# Patient Record
Sex: Male | Born: 1940 | Hispanic: No | Marital: Married | Smoking: Former smoker
Health system: Southern US, Community
[De-identification: ages and names within clinical notes are randomized; demographics above are authoritative.]

## PROBLEM LIST (undated history)

## (undated) DIAGNOSIS — E039 Hypothyroidism, unspecified: Secondary | ICD-10-CM

## (undated) HISTORY — DX: Hypothyroidism, unspecified: E03.9

---

## 2008-08-30 ENCOUNTER — Ambulatory Visit: Payer: Self-pay | Admitting: Internal Medicine

## 2008-08-30 DIAGNOSIS — J45909 Unspecified asthma, uncomplicated: Secondary | ICD-10-CM | POA: Insufficient documentation

## 2008-08-30 DIAGNOSIS — K5900 Constipation, unspecified: Secondary | ICD-10-CM | POA: Insufficient documentation

## 2008-08-30 DIAGNOSIS — J309 Allergic rhinitis, unspecified: Secondary | ICD-10-CM | POA: Insufficient documentation

## 2008-08-30 DIAGNOSIS — E039 Hypothyroidism, unspecified: Secondary | ICD-10-CM | POA: Insufficient documentation

## 2008-09-03 ENCOUNTER — Encounter (INDEPENDENT_AMBULATORY_CARE_PROVIDER_SITE_OTHER): Payer: Self-pay | Admitting: Internal Medicine

## 2008-09-04 ENCOUNTER — Encounter (INDEPENDENT_AMBULATORY_CARE_PROVIDER_SITE_OTHER): Payer: Self-pay | Admitting: Internal Medicine

## 2008-09-06 ENCOUNTER — Encounter (INDEPENDENT_AMBULATORY_CARE_PROVIDER_SITE_OTHER): Payer: Self-pay | Admitting: Internal Medicine

## 2008-09-12 ENCOUNTER — Telehealth (INDEPENDENT_AMBULATORY_CARE_PROVIDER_SITE_OTHER): Payer: Self-pay | Admitting: Internal Medicine

## 2008-09-30 ENCOUNTER — Telehealth (INDEPENDENT_AMBULATORY_CARE_PROVIDER_SITE_OTHER): Payer: Self-pay | Admitting: Internal Medicine

## 2008-10-01 ENCOUNTER — Encounter (INDEPENDENT_AMBULATORY_CARE_PROVIDER_SITE_OTHER): Payer: Self-pay | Admitting: Internal Medicine

## 2008-10-11 ENCOUNTER — Ambulatory Visit: Payer: Self-pay | Admitting: Internal Medicine

## 2008-10-14 ENCOUNTER — Encounter (INDEPENDENT_AMBULATORY_CARE_PROVIDER_SITE_OTHER): Payer: Self-pay | Admitting: Internal Medicine

## 2008-10-15 LAB — CONVERTED CEMR LAB
Free T4: 1.02 ng/dL (ref 0.80–1.80)
T3, Free: 3.2 pg/mL (ref 2.3–4.2)
TSH: 1.153 microintl units/mL (ref 0.350–4.500)

## 2008-12-09 ENCOUNTER — Telehealth (INDEPENDENT_AMBULATORY_CARE_PROVIDER_SITE_OTHER): Payer: Self-pay | Admitting: Internal Medicine

## 2009-01-24 ENCOUNTER — Ambulatory Visit: Payer: Self-pay | Admitting: Internal Medicine

## 2009-01-31 ENCOUNTER — Telehealth (INDEPENDENT_AMBULATORY_CARE_PROVIDER_SITE_OTHER): Payer: Self-pay | Admitting: *Deleted

## 2009-02-14 ENCOUNTER — Ambulatory Visit: Payer: Self-pay | Admitting: Internal Medicine

## 2009-03-26 ENCOUNTER — Encounter (INDEPENDENT_AMBULATORY_CARE_PROVIDER_SITE_OTHER): Payer: Self-pay | Admitting: Internal Medicine

## 2009-03-31 ENCOUNTER — Encounter (INDEPENDENT_AMBULATORY_CARE_PROVIDER_SITE_OTHER): Payer: Self-pay | Admitting: Internal Medicine

## 2009-05-28 ENCOUNTER — Encounter (HOSPITAL_COMMUNITY): Admission: RE | Admit: 2009-05-28 | Discharge: 2009-06-27 | Payer: Self-pay | Admitting: *Deleted

## 2010-04-22 ENCOUNTER — Ambulatory Visit: Payer: Self-pay | Admitting: Internal Medicine

## 2010-05-06 ENCOUNTER — Ambulatory Visit: Payer: Self-pay | Admitting: Internal Medicine

## 2010-07-16 ENCOUNTER — Ambulatory Visit: Admit: 2010-07-16 | Payer: Self-pay | Admitting: Internal Medicine

## 2010-08-10 ENCOUNTER — Ambulatory Visit (INDEPENDENT_AMBULATORY_CARE_PROVIDER_SITE_OTHER): Payer: Self-pay | Admitting: Internal Medicine

## 2010-09-09 ENCOUNTER — Ambulatory Visit (INDEPENDENT_AMBULATORY_CARE_PROVIDER_SITE_OTHER): Payer: Self-pay | Admitting: Internal Medicine

## 2010-09-15 ENCOUNTER — Ambulatory Visit (INDEPENDENT_AMBULATORY_CARE_PROVIDER_SITE_OTHER): Payer: MEDICARE | Admitting: Internal Medicine

## 2010-09-15 DIAGNOSIS — K5909 Other constipation: Secondary | ICD-10-CM

## 2010-11-16 ENCOUNTER — Ambulatory Visit (INDEPENDENT_AMBULATORY_CARE_PROVIDER_SITE_OTHER): Payer: MEDICARE | Admitting: Internal Medicine

## 2010-11-16 DIAGNOSIS — K5909 Other constipation: Secondary | ICD-10-CM

## 2011-01-13 ENCOUNTER — Encounter (INDEPENDENT_AMBULATORY_CARE_PROVIDER_SITE_OTHER): Payer: Self-pay

## 2011-01-13 DIAGNOSIS — E039 Hypothyroidism, unspecified: Secondary | ICD-10-CM | POA: Insufficient documentation

## 2011-01-26 ENCOUNTER — Ambulatory Visit (INDEPENDENT_AMBULATORY_CARE_PROVIDER_SITE_OTHER): Payer: MEDICARE | Admitting: Internal Medicine

## 2011-02-08 ENCOUNTER — Encounter (INDEPENDENT_AMBULATORY_CARE_PROVIDER_SITE_OTHER): Payer: Self-pay | Admitting: *Deleted

## 2011-04-20 ENCOUNTER — Encounter (INDEPENDENT_AMBULATORY_CARE_PROVIDER_SITE_OTHER): Payer: Self-pay | Admitting: Internal Medicine

## 2011-04-20 ENCOUNTER — Ambulatory Visit (INDEPENDENT_AMBULATORY_CARE_PROVIDER_SITE_OTHER): Payer: Medicare Other | Admitting: Internal Medicine

## 2011-04-20 VITALS — BP 110/70 | HR 72 | Temp 98.1°F | Resp 14 | Ht 71.0 in | Wt 180.0 lb

## 2011-04-20 DIAGNOSIS — K5900 Constipation, unspecified: Secondary | ICD-10-CM

## 2011-04-20 DIAGNOSIS — K5909 Other constipation: Secondary | ICD-10-CM

## 2011-04-20 NOTE — Patient Instructions (Signed)
No changes made in your meds today.

## 2011-04-25 NOTE — Progress Notes (Signed)
Presenting complaint; followup for bowel problems and constipation. Subjective; Omar Cox is 70 year old Caucasian male who is here for scheduled visit. 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 He has undergone extensive evaluation including anorectal manometry when he was living in [place]. This workup has been documented in my previous notes. He was last seen on 11/16/2010. He believes is 60% better. Dr. Malvin Johns suggested to him to go on gluten-free diet but 2 months ago. He is thrilled that he has lost 12 pounds. He generally has one to 2 stools daily he seemed to get loose stool when he takes magnesium he denies melena or rectal bleeding. He always has sense of incomplete evacuation even though he's had good bowel movement. He would go he's not 100% better he states he is doing much better than last year. Current medications Current Outpatient Prescriptions  Medication Sig Dispense Refill  . aspirin 81 MG tablet Take 81 mg by mouth daily.        . BUSPIRONE HCL PO Take 300 mg by mouth daily. THIS IS BUSPIRONE XL 150 MG DAILY      . fish oil-omega-3 fatty acids 1000 MG capsule Take 1 g by mouth daily. 950 mg      . GAMMA AMINOBUTYRIC ACID PO Take 1 capsule by mouth daily.        Marland Kitchen levothyroxine (SYNTHROID, LEVOTHROID) 100 MCG tablet Take 100 mcg by mouth daily.        Marland Kitchen lubiprostone (AMITIZA) 8 MCG capsule Take 8 mcg by mouth. Takes 3 capsules every night      . Specialty Vitamins Products (MAGNESIUM, AMINO ACID CHELATE,) 133 MG tablet Take 1 tablet by mouth 2 (two) times daily. 500 mg        objective BP 110/70  Pulse 72  Temp(Src) 98.1 F (36.7 C) (Oral)  Resp 14  Ht 5\' 11"  (1.803 m)  Wt 180 lb (81.647 kg)  BMI 25.10 kg/m2 Conjunctiva is pink. Sclerae nonicteric. Oropharyngeal mucosa is normal No neck masses are noted Abdomen is flat bowel, sounds are normal. Soft abdomen without tenderness organomegaly or masses. No clubbing  noted. Assessment Defecation disorder with constipation and sense of incomplete evacuation. He has been tried on different medications including lorazepam and nothing has worked well. Dr. Delbert Harness increase the dose of buspirone but it did not make any difference. As versus constipation is concerned he is doing much better with therapy including low-dose amitiza. Recommendations. He will continue current regimen. Next office visit in 6 months.

## 2011-10-07 ENCOUNTER — Encounter (INDEPENDENT_AMBULATORY_CARE_PROVIDER_SITE_OTHER): Payer: Self-pay | Admitting: *Deleted

## 2011-11-16 ENCOUNTER — Encounter (INDEPENDENT_AMBULATORY_CARE_PROVIDER_SITE_OTHER): Payer: Self-pay | Admitting: Internal Medicine

## 2011-11-16 ENCOUNTER — Ambulatory Visit (INDEPENDENT_AMBULATORY_CARE_PROVIDER_SITE_OTHER): Payer: Medicare Other | Admitting: Internal Medicine

## 2011-11-16 VITALS — BP 118/70 | HR 72 | Temp 97.1°F | Resp 20 | Ht 72.0 in | Wt 186.6 lb

## 2011-11-16 DIAGNOSIS — K5900 Constipation, unspecified: Secondary | ICD-10-CM

## 2011-11-16 DIAGNOSIS — K9041 Non-celiac gluten sensitivity: Secondary | ICD-10-CM | POA: Insufficient documentation

## 2011-11-16 DIAGNOSIS — K900 Celiac disease: Secondary | ICD-10-CM

## 2011-11-16 NOTE — Progress Notes (Signed)
Presenting complaint;  Followup for bowel problems.  Subjective:  Omar Cox a 71 year old Caucasian male who is in for scheduled visit. He was last seen in October 2012. He decided to go go on gluten-free diet after he talked to Dr. Malvin Johns about 3 months ago. He feels a lot better. He has noted decrease in his bloating and his bowels move more on schedule and he has less sensation of incomplete evacuation and on some days he's not aware of the sensation. His appetite is good. He has gained 6 pounds since his last visit. He reports no side effects with the current dose of Amitiza.  Current Medications: Current Outpatient Prescriptions  Medication Sig Dispense Refill  . aspirin 81 MG tablet Take 81 mg by mouth daily.        . BUSPIRONE HCL PO Take 300 mg by mouth daily. THIS IS BUSPIRONE XL 150 MG DAILY      . fish oil-omega-3 fatty acids 1000 MG capsule Take 1 g by mouth daily. 950 mg      . GAMMA AMINOBUTYRIC ACID PO Take 1 capsule by mouth daily.        Marland Kitchen levothyroxine (SYNTHROID, LEVOTHROID) 100 MCG tablet Take 100 mcg by mouth daily.        Marland Kitchen lubiprostone (AMITIZA) 24 MCG capsule Take 24 mcg by mouth. Patient takes (2) at night      . Multiple Vitamin (MULTIVITAMIN) capsule Take 1 capsule by mouth daily.         Objective: Blood pressure 118/70, pulse 72, temperature 97.1 F (36.2 C), temperature source Oral, resp. rate 20, height 6' (1.829 m), weight 186 lb 9.6 oz (84.641 kg). Conjunctiva is pink. Sclera is nonicteric Oropharyngeal mucosa is normal. No neck masses or thyromegaly noted. Abdomen is symmetrical soft and nontender without organomegaly or masses No LE edema or clubbing noted.  Assessment:  Defecation disorder but constipation and sense of incomplete evacuation. He underwent caudal evaluation in [place] and he had colonoscopy and a rectal manometry. He is doing much better with gluten-free diet. His evaluation previously for celiac disease has been negative. He  may have gluten intolerance or maybe he is having less bloating or change in his diet.   Plan:  Patient will continue Amitiza at current dose. He will stay on gluten-free diet as long as it is helping him. Office visit in one year unless symptoms progress

## 2011-11-16 NOTE — Patient Instructions (Signed)
Notify if symptoms relapse. 

## 2012-04-25 ENCOUNTER — Telehealth (INDEPENDENT_AMBULATORY_CARE_PROVIDER_SITE_OTHER): Payer: Self-pay | Admitting: *Deleted

## 2012-04-25 NOTE — Telephone Encounter (Signed)
Patient call and would like to start winging himself off the Amitiza. Currently taking 24 mcg and is feeling much better. His next apt is not due until May 2014. He would like to just speak with someone about this. His return phone number is [phone].

## 2012-04-27 NOTE — Telephone Encounter (Signed)
Doctor Karilyn Cota was paged

## 2012-04-28 NOTE — Telephone Encounter (Signed)
Patient called and told that per Dr.Rehman he may tapper the Amitiza or he could just stop the medication. If the Constipation should start again we would put him back on the medication. The patient states that he has the 8 mcg and he is going to tapper with those. He was advised that if he had any further concerns or questions to please call our office.

## 2012-11-09 ENCOUNTER — Encounter (INDEPENDENT_AMBULATORY_CARE_PROVIDER_SITE_OTHER): Payer: Self-pay | Admitting: *Deleted

## 2012-11-28 ENCOUNTER — Ambulatory Visit (INDEPENDENT_AMBULATORY_CARE_PROVIDER_SITE_OTHER): Payer: Medicare Other | Admitting: Internal Medicine

## 2014-04-23 ENCOUNTER — Ambulatory Visit (HOSPITAL_COMMUNITY)
Admission: RE | Admit: 2014-04-23 | Discharge: 2014-04-23 | Disposition: A | Discharge status: Home / Self Care | Payer: Medicare Other | Source: Ambulatory Visit | Attending: General Surgery | Admitting: General Surgery

## 2014-04-23 ENCOUNTER — Other Ambulatory Visit (HOSPITAL_COMMUNITY): Payer: Self-pay | Admitting: General Surgery

## 2014-04-23 DIAGNOSIS — M179 Osteoarthritis of knee, unspecified: Secondary | ICD-10-CM | POA: Diagnosis not present

## 2014-04-23 DIAGNOSIS — M25561 Pain in right knee: Secondary | ICD-10-CM

## 2014-04-24 ENCOUNTER — Encounter: Payer: Self-pay | Admitting: Orthopedic Surgery

## 2014-04-24 ENCOUNTER — Ambulatory Visit (INDEPENDENT_AMBULATORY_CARE_PROVIDER_SITE_OTHER): Payer: Medicare Other | Admitting: Orthopedic Surgery

## 2014-04-24 VITALS — BP 146/84 | Ht 72.0 in | Wt 183.0 lb

## 2014-04-24 DIAGNOSIS — M1711 Unilateral primary osteoarthritis, right knee: Secondary | ICD-10-CM

## 2014-04-24 NOTE — Progress Notes (Signed)
New  Chief Complaint  Patient presents with  . Knee Pain    osteoarthritis right knee    [age] year old male referred to me by Dr. Malvin JohnsBradford. [facility] [place]. He complains today of difficulties with his right knee.  Summary years ago [place] and was told that he would need a knee replacement. However, the patient is very active healthy and wishes to have alternative treatments if possible. He's had an x-ray. I interpret the x-ray is severe arthritis with slight valgus alignment right knee.  He really complains of dull aching pain when he's walking. He can ride his bike he can do most other activities although he does have activity-related pain. He relates his pain is 6/10 he takes aspirin. He was treated with physical therapy in the past. He has not had injection of cartilage supplement or any arthritis medication.  Review of systems she reveals that he has some constipation and joint pain otherwise his other systems reviewed were normal  medical history of asthma otherwise healthy.  He does not indicate any previous surgeries.  He does take Synthroid. allergies none.  family history negative.  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  Orientation person place and time. Mood and affect normal.  Gait and station are normal except for the valgus alignment to the knee.  Upper extremity is or normal except for some arthritic changes limiting some of the motion in the right thumb.  His left knee range of motion is limited to 120, it is stable. Muscle tone and strength are normal and he does not have effusion or tenderness this knee seems to be in varus, skin normal.  The right knee is in valgus with lateral joint line tenderness. His  McMurray sign produced lateral joint pain his range of motion is also limited at 110 but the ligaments were stable. Strength was normal muscle tone normal skin normal.  Distal pulses intact no edema bilaterally. Normal sensation in both legs.  I interpret the x-rays valgus arthritis.  I think it would be prudent to at least try some nonoperative measures.  We injected his right knee we offered him a brace he may get that later he's already wearing some knee sleeves.  He would also benefit potentially from cartilage supplementation and or Orthovisc.  Followup 3 weeks  Procedure note right knee injection verbal consent was obtained to inject right knee joint  Timeout was completed to confirm the site of injection  The medications used were 40 mg of Depo-Medrol and 1% lidocaine 3 cc  Anesthesia was provided by ethyl chloride and the skin was prepped with alcohol.  After cleaning the skin with alcohol a 20-gauge needle was used to inject the right knee joint. There were no complications. A sterile bandage was applied.

## 2014-04-24 NOTE — Patient Instructions (Signed)
Joint Injection Care After Refer to this sheet in the next few days. These instructions provide you with information on caring for yourself after you have had a joint injection. Your caregiver also may give you more specific instructions. Your treatment has been planned according to current medical practices, but problems sometimes occur. Call your caregiver if you have any problems or questions after your procedure. After any type of joint injection, it is not uncommon to experience:  Soreness, swelling, or bruising around the injection site.  Mild numbness, tingling, or weakness around the injection site caused by the numbing medicine used before or with the injection. It also is possible to experience the following effects associated with the specific agent after injection:  Iodine-based contrast agents:  Allergic reaction (itching, hives, widespread redness, and swelling beyond the injection site).  Corticosteroids (These effects are rare.):  Allergic reaction.  Increased blood sugar levels (If you have diabetes and you notice that your blood sugar levels have increased, notify your caregiver).  Increased blood pressure levels.  Mood swings.  Hyaluronic acid in the use of viscosupplementation.  Temporary heat or redness.  Temporary rash and itching.  Increased fluid accumulation in the injected joint. These effects all should resolve within a day after your procedure.  HOME CARE INSTRUCTIONS  Limit yourself to light activity the day of your procedure. Avoid lifting heavy objects, bending, stooping, or twisting.  Take prescription or over-the-counter pain medication as directed by your caregiver.  You may apply ice to your injection site to reduce pain and swelling the day of your procedure. Ice may be applied 03-04 times:  Put ice in a plastic bag.  Place a towel between your skin and the bag.  Leave the ice on for no longer than 15-20 minutes each time. SEEK  IMMEDIATE MEDICAL CARE IF:   Pain and swelling get worse rather than better or extend beyond the injection site.  Numbness does not go away.  Blood or fluid continues to leak from the injection site.  You have chest pain.  You have swelling of your face or tongue.  You have trouble breathing or you become dizzy.  You develop a fever, chills, or severe tenderness at the injection site that last longer than 1 day. MAKE SURE YOU:  Understand these instructions.  Watch your condition.  Get help right away if you are not doing well or if you get worse. Document Released: 03/04/2011 Document Revised: 09/13/2011 Document Reviewed: 03/04/2011 [facility] Patient Information 2015 [employer] [place]. This information is not intended to replace advice given to you by your health care provider. Make sure you discuss any questions you have with your health care provider. Total Knee Replacement Total knee replacement is a procedure to replace your knee joint with an artificial knee joint (prosthetic knee joint). The purpose of this surgery is to reduce pain and improve your knee function. LET [facility]OUR HEALTH CARE PROVIDER KNOW ABOUT:   Any allergies you have.  All medicines you are taking, including vitamins, herbs, eye drops, creams, and over-the-counter medicines.  Previous problems you or members of your family have had with the use of anesthetics.  Any blood disorders you have.  Previous surgeries you have had.  Medical conditions you have. RISKS AND COMPLICATIONS  Generally, total knee replacement is a safe procedure. However, problems can occur and include:  Loss of range of motion of the knee or instability.  Loosening of the prosthesis.  Infection.  Persistent pain. BEFORE THE PROCEDURE  Do not eat or drink anything after midnight on the night before the procedure or as directed by your health care provider.  Ask your health care provider about changing or stopping your regular  medicines. This is especially important if you are taking diabetes medicines or blood thinners. PROCEDURE   Just before the procedure, you will receive medicine that will make you drowsy (sedative). This will be given through a tube that is inserted into one of your veins (IV tube).  Then you will be given one of the following:  A medicine injected into your spine that numbs your body below the waist (spinal anesthetic).  A medicine that makes you fall asleep (general anesthetic).  You may also receive medicine to block feeling in your leg (nerve block) to help ease pain after surgery.  An incision will be made in your knee. Your surgeon will take out any damaged cartilage and bone by sawing off the damaged surfaces.  The surgeon will then put a new metal liner over the sawed-off portion of your thigh bone (femur) and a plastic liner over the sawed-off portion of one of the bones of your lower leg (tibia). This is to restore alignment and function to your knee. A plastic piece is often used to restore the surface of your knee cap. AFTER THE PROCEDURE   You will be taken to the recovery area.  You may have drainage tubes to drain excess fluid from your knee. These tubes attach to a device that removes these fluids.  Once you are awake, stable, and taking fluids well, you will be taken to your hospital room.  You will receive physical therapy as prescribed by your health care provider.  Your surgeon may recommend that you spend time (usually an additional 10-14 days) in an extended-care facility to help you begin walking again and improve your range of motion before you go home.  You may also be prescribed blood-thinning medicine to decrease your risk of developing blood clots in your leg. Document Released: 09/27/2000 Document Revised: 11/05/2013 Document Reviewed: 08/01/2011 [facility] Patient Information 2015 [employer] [place]. This information is not intended to replace advice given to  you by your health care provider. Make sure you discuss any questions you have with your health care provider.

## 2014-05-14 ENCOUNTER — Ambulatory Visit: Payer: Medicare Other | Admitting: Orthopedic Surgery

## 2014-11-08 ENCOUNTER — Other Ambulatory Visit (HOSPITAL_COMMUNITY): Payer: Self-pay | Admitting: Family Medicine

## 2014-11-08 ENCOUNTER — Ambulatory Visit (HOSPITAL_COMMUNITY)
Admission: RE | Admit: 2014-11-08 | Discharge: 2014-11-08 | Disposition: A | Discharge status: Home / Self Care | Payer: Medicare Other | Source: Ambulatory Visit | Attending: Family Medicine | Admitting: Family Medicine

## 2014-11-08 DIAGNOSIS — M179 Osteoarthritis of knee, unspecified: Secondary | ICD-10-CM | POA: Diagnosis not present

## 2014-11-08 DIAGNOSIS — G8929 Other chronic pain: Secondary | ICD-10-CM

## 2014-11-08 DIAGNOSIS — M25561 Pain in right knee: Secondary | ICD-10-CM | POA: Insufficient documentation

## 2016-10-19 IMAGING — DX DG KNEE COMPLETE 4+V*R*
4 series · 4 of 4 positions shown · non-contrast
Comparison: April 23, 2014

CLINICAL DATA: Chronic knee pain and swelling. No history of recent
trauma

EXAM:
RIGHT KNEE - COMPLETE 4+ VIEW

[knee ap]
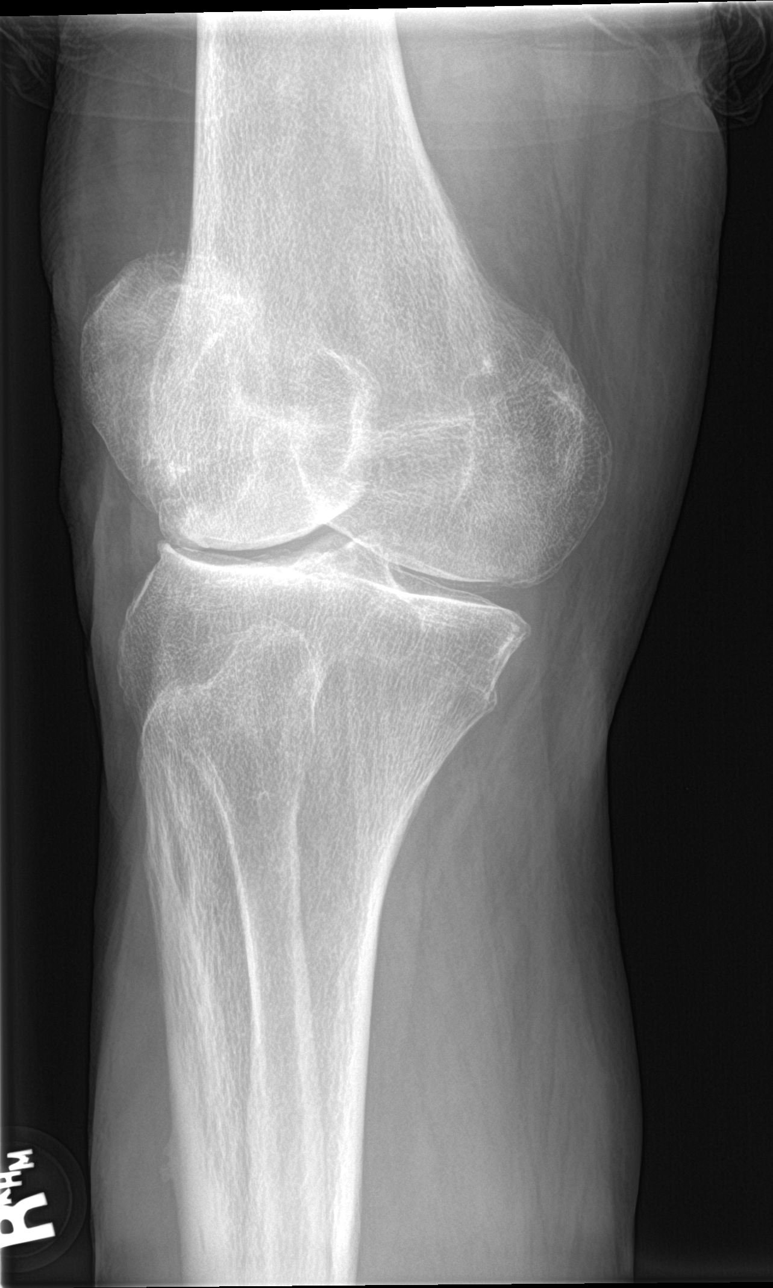

[knee obl (1 of 2)]
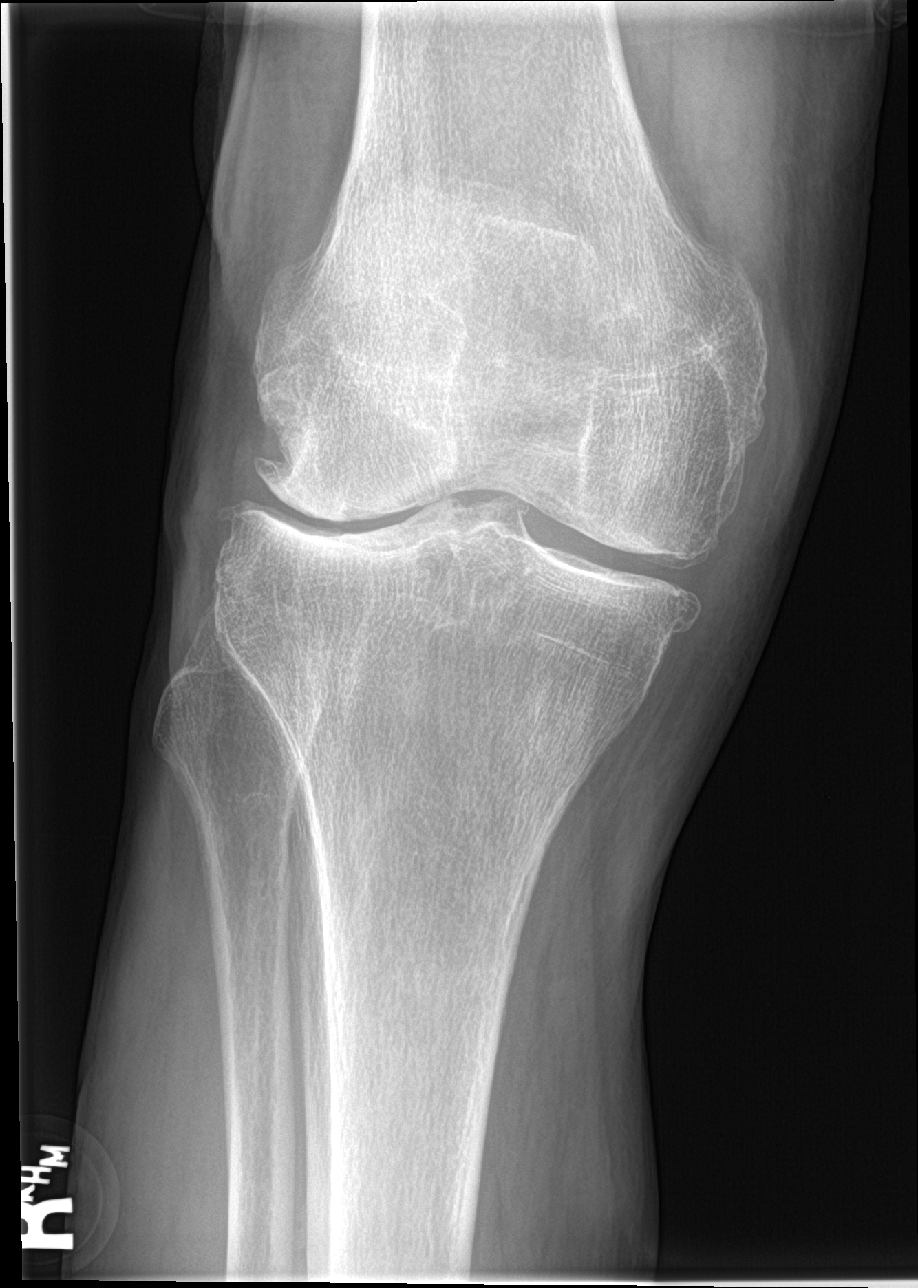

[knee obl (2 of 2)]
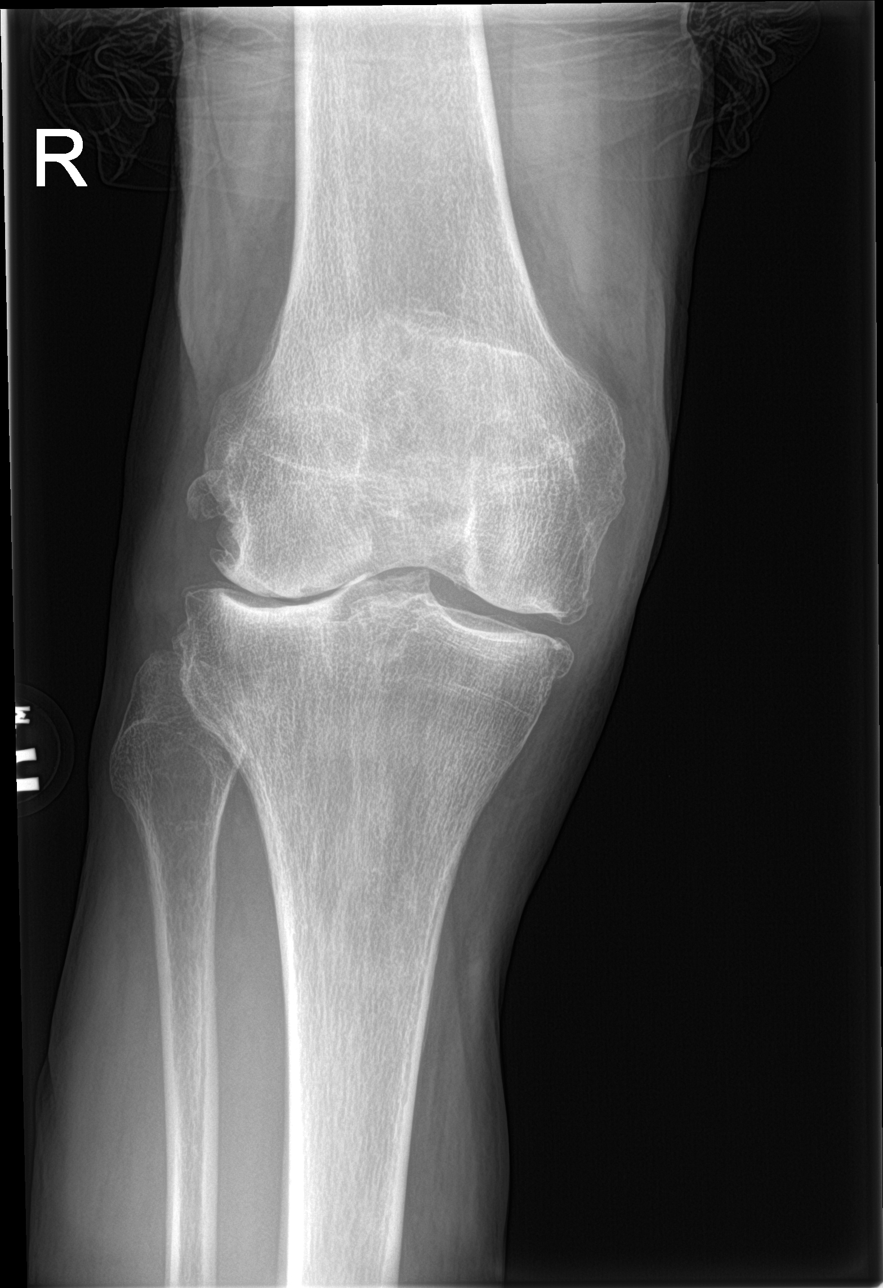

[knee lat]
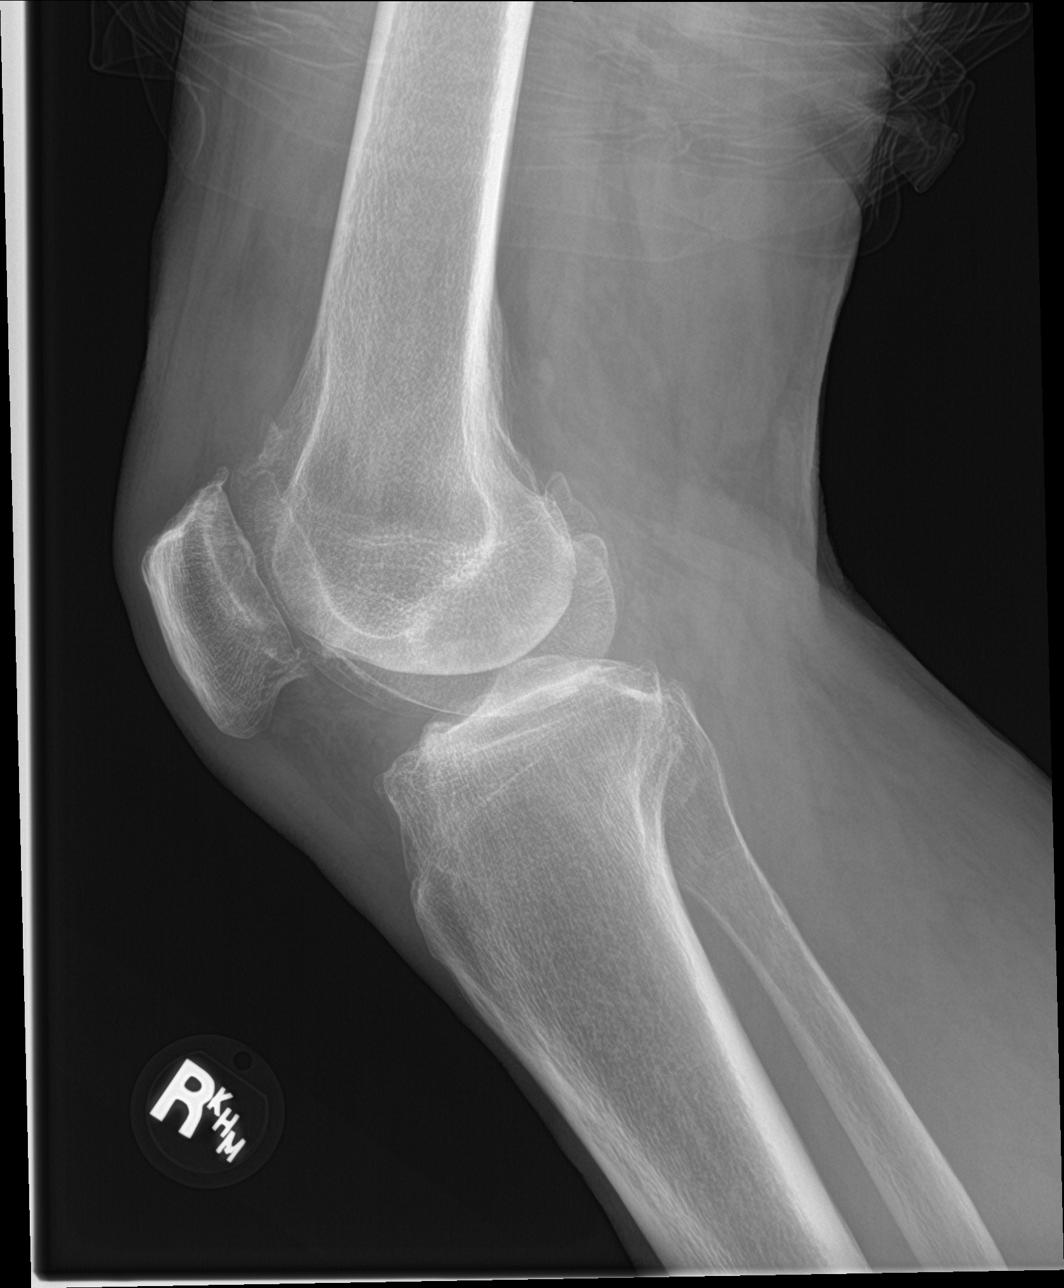

[4 of 4 positions shown; findings below may reference images not displayed]

FINDINGS: Frontal, lateral, and bilateral oblique views were obtained. There
is no fracture or dislocation. No joint effusion. There is spurring
in all compartments. There is narrowing in all compartments, with
greater narrowing laterally and in the patellofemoral joint compared
to medially. No erosive change.
IMPRESSION: Osteoarthritic change, essentially stable from prior study. No
fracture or joint effusion.

## 2019-09-03 DEATH — deceased
